# Patient Record
Sex: Female | Born: 2000 | Race: White | Hispanic: No | Marital: Single | Smoking: Never smoker
Health system: Southern US, Community
[De-identification: ages and names within clinical notes are randomized; demographics above are authoritative.]

## PROBLEM LIST (undated history)

## (undated) DIAGNOSIS — G43909 Migraine, unspecified, not intractable, without status migrainosus: Secondary | ICD-10-CM

## (undated) DIAGNOSIS — T7840XA Allergy, unspecified, initial encounter: Secondary | ICD-10-CM

## (undated) DIAGNOSIS — F419 Anxiety disorder, unspecified: Secondary | ICD-10-CM

## (undated) HISTORY — DX: Allergy, unspecified, initial encounter: T78.40XA

## (undated) HISTORY — DX: Migraine, unspecified, not intractable, without status migrainosus: G43.909

## (undated) HISTORY — DX: Anxiety disorder, unspecified: F41.9

---

## 2019-10-07 ENCOUNTER — Encounter: Payer: Self-pay | Admitting: Physical Therapy

## 2019-10-07 ENCOUNTER — Ambulatory Visit: Payer: Self-pay | Attending: Physician Assistant | Admitting: Physical Therapy

## 2019-10-07 ENCOUNTER — Other Ambulatory Visit: Payer: Self-pay

## 2019-10-07 DIAGNOSIS — R278 Other lack of coordination: Secondary | ICD-10-CM | POA: Insufficient documentation

## 2019-10-07 DIAGNOSIS — R252 Cramp and spasm: Secondary | ICD-10-CM | POA: Insufficient documentation

## 2019-10-07 NOTE — Patient Instructions (Addendum)
Guided Meditation for Pelvic Floor Relaxation  [employer]   Pelvic Floor Release Stretches (NEW)  [employer]  [facility] [address], [address] [place], [place] [address] Phone # [phone] Fax [phone]

## 2019-10-10 NOTE — Therapy (Signed)
[facility] [address], [place], [place], [address] Phone: [phone]   Fax:  [phone]  Physical Therapy Evaluation  Patient Details  Name: Allison Cole MRN: [id number] Date of Birth: [birth year] Referring Provider (PT): Elmer Sow PA-C; Jeannie Done, CNM   Encounter Date: 10/07/2019  Visit 1 PT Start time: 8:45 PT end time: 9:25 Total time 35 minutes Equipment Utilized During Treatment --  Activity Tolerance Patient tolerated treatment well; No increased pain  Behavior During Therapy [facility] for tasks assessed/performed    Equipment Utilized During Treatment --  Activity Tolerance Patient tolerated treatment well; No increased pain  Behavior During Therapy WFL for tasks assessed/performed     Past Medical History:  Diagnosis Date  . Allergy   . Anxiety   . Migraines     History reviewed. No pertinent surgical history.  There were no vitals filed for this visit.  Overall strength is 5/5 tenderness located in the right lower abdomen,  q-tip test with tenderness located on the right side f the vulva and 2 O'clock Patient confirms identification and approves PT to assess pelvic floor and treatment Exam type is vaginal Pelvic floor strength is 2/5 Patient was able to tolerate the insertion of the q-tip but she had pain on the sides of the vaginal; contraction was more posterior than anterior, did not use the therapist index finger due to pain Marinoff score is 2/3. Patient reports no urinary leakage.   Plan   Clinical Impression Statement Patient is a 19 year old female with vaginismus for the past 8 months. Patient does reports she did change her birth control around that time. Patient pain level with penetration of the vaginal canal is at 6/10. Patient has pain with intercourse and has to stop in the middle, pain with tampon and vaginal exam. Patient right ilium is anteriorly rotated and positive  compression test for SI on the right. Q-tip test shows tenderness located on the right side of the vulva and at 1 O'clock. Patient had tenderness with inserting the Q-tip into the vaginal canal. Patient was able to perform a pelvic floor contraction with posterior lift and no movement of the clitoris. Patient will benefit from skilled therapy to improve elongation of the vaginal tissue, desensitize the vulva area, correct pelvis and work on coordination of the muscles.  Personal Factors and Comorbidities Sex  Examination-Activity Limitations --  Examination-Participation Restrictions Interpersonal Relationship  Pt will benefit from skilled therapeutic intervention in order to improve on the following deficits Decreased coordination; Increased fascial restricitons; Increased muscle spasms; Pain; Decreased activity tolerance  Stability/Clinical Decision Making Stable/Uncomplicated  Clinical Decision Making Low  Rehab Potential Excellent  PT Frequency 1x / week  PT Duration 12 weeks  PT Treatment/Interventions Biofeedback; Cryotherapy; Electrical Stimulation; Moist Heat; Neuromuscular re-education; Therapeutic exercise; Therapeutic activities; Patient/family education; Manual techniques; Dry needling  PT Next Visit Plan tissue rolling of the thighs, correct pelvis, fascial release around the vagina  PT Home Exercise Plan --  Recommended Other Services --  Consulted and Agree with Plan of Care Patient                    Objective measurements completed on examination: See above findings.                PT Short Term Goals - 10/07/19 1032      PT SHORT TERM GOAL #1   Title  independent with initial HEP    Time  4    Period  Weeks    Status  New    Target Date  11/04/19      PT SHORT TERM GOAL #2   Title  understand how to perform gentle massage around the vulva area to improve blood flow    Time  4    Period  Weeks    Status  New    Target Date  11/04/19       PT SHORT TERM GOAL #3   Title  does pelvic floor meditation 2-3 times per week to desensitize the vaginal area and calm the nervouse system    Time  4    Period  Weeks    Status  New    Target Date  11/04/19      PT SHORT TERM GOAL #4   Title  able to have a q-tip inserted into the vaginal canal with no more than 1/10 pain.    Time  4    Period  Weeks    Status  New    Target Date  11/04/19        PT Long Term Goals - 10/07/19 1035      PT LONG TERM GOAL #1   Title  independent with advanced HEP    Time  12    Period  Weeks    Status  New    Target Date  12/30/19      PT LONG TERM GOAL #2   Title  Marinoff score with penile penetration 1/3 so she is able to have penetrative intercourse and not have to stop in the middle    Time  12    Period  Weeks    Status  New    Target Date  12/30/19      PT LONG TERM GOAL #3   Title  able to take her Nuva ring in and out of the vaginal canal with not more than 1-2/10 pain    Time  12    Period  Weeks    Status  New    Target Date  12/30/19      PT LONG TERM GOAL #4   Title  able to use a tampon during her cycle with no more than 1-2/10 pain due to elongation of the vaginal tissue    Time  12    Period  Weeks    Status  New    Target Date  12/30/19               Patient will benefit from skilled therapeutic intervention in order to improve the following deficits and impairments:  Decreased coordination, Increased fascial restricitons, Increased muscle spasms, Pain, Decreased activity tolerance  Visit Diagnosis: Other lack of coordination - Plan: PT plan of care cert/re-cert  Cramp and spasm - Plan: PT plan of care cert/re-cert     Problem List There are no problems to display for this patient.   Lovey Crupi 10/10/2019, 12:53 PM  [facility] [address] [place], [place], [address] Phone: [phone]   Fax:  [phone]  Name: Allison Cole MRN: [id number] Date of Birth: 2001/03/29

## 2019-11-11 ENCOUNTER — Other Ambulatory Visit: Payer: Self-pay

## 2019-11-11 ENCOUNTER — Ambulatory Visit: Payer: Self-pay | Attending: Physician Assistant | Admitting: Physical Therapy

## 2019-11-11 ENCOUNTER — Encounter: Payer: Self-pay | Admitting: Physical Therapy

## 2019-11-11 DIAGNOSIS — R278 Other lack of coordination: Secondary | ICD-10-CM | POA: Insufficient documentation

## 2019-11-11 DIAGNOSIS — R252 Cramp and spasm: Secondary | ICD-10-CM | POA: Insufficient documentation

## 2019-11-11 NOTE — Therapy (Signed)
[facility] [address], [address] [place], [place], [address] Phone: [phone]   Fax:  [phone]  Physical Therapy Treatment  Patient Details  Name: Allison Cole MRN: [id number] Date of Birth: 07-16-2000 Referring Provider (PT): Caryl Bis PA-C; Ermelinda Das, CNM   Encounter Date: 11/11/2019  PT End of Session - 11/11/19 0937    Visit Number  2    Date for PT Re-Evaluation  12/30/19    PT Start Time  0930    PT Stop Time  1010    PT Time Calculation (min)  40 min    Activity Tolerance  Patient tolerated treatment well;No increased pain    Behavior During Therapy  WFL for tasks assessed/performed       Past Medical History:  Diagnosis Date  . Allergy   . Anxiety   . Migraines     History reviewed. No pertinent surgical history.  There were no vitals filed for this visit.  Subjective Assessment - 11/11/19 0933    Subjective  The outside of the vaginal feels great. Patient had her menstration and tampon has hurt. The meditation and HEP has helped. Taking the ring out does not help.    Patient Stated Goals  have comfortable sex    Currently in Pain?  Yes    Pain Score  7     Pain Location  Vagina    Pain Orientation  Mid   deeper   Pain Descriptors / Indicators  --   stinging   Pain Type  Acute pain    Pain Onset  More than a month ago    Pain Frequency  Intermittent    Aggravating Factors   deeper penile penetration; initial grabing of the ring hurts    Pain Relieving Factors  no penile penetration                     Pelvic Floor Special Questions - 11/11/19 0001    Pelvic Floor Internal Exam  Patient confirms identification and approves PT to assess pelvic floor and treatment    Exam Type  Vaginal        [facility] Adult PT Treatment/Exercise - 11/11/19 0001      Lumbar Exercises: Stretches   Lower Trunk Rotation  2 reps;30 seconds    Lower Trunk Rotation Limitations  right, left    Press Ups  10 reps      Lumbar Exercises: Quadruped   Madcat/Old Horse  15 reps      Manual Therapy   Manual Therapy  Soft tissue mobilization;Internal Pelvic Floor    Soft tissue mobilization  to bilateral hip adductors to relax the muslces and prepare for tissue work to the vaginal    Internal Pelvic Floor  fascial  release of the introitus with using the desert harvest revelum. able to place the finger in 0.5 inch             PT Education - 11/11/19 1009    Education Details  education on how to massage the internal introitus    Person(s) Educated  Patient    Methods  Explanation;Demonstration;Handout    Comprehension  Verbalized understanding       PT Short Term Goals - 11/11/19 1015      PT SHORT TERM GOAL #1   Title  independent with initial HEP    Time  4    Period  Weeks    Status  Achieved  PT SHORT TERM GOAL #2   Title  understand how to perform gentle massage around the vulva area to improve blood flow    Time  4    Period  Weeks    Status  Achieved      PT SHORT TERM GOAL #3   Title  does pelvic floor meditation 2-3 times per week to desensitize the vaginal area and calm the nervouse system    Time  4    Period  Weeks    Status  Achieved      PT SHORT TERM GOAL #4   Title  able to have a q-tip inserted into the vaginal canal with no more than 1/10 pain.    Time  4    Period  Weeks    Status  Achieved        PT Long Term Goals - 10/07/19 1035      PT LONG TERM GOAL #1   Title  independent with advanced HEP    Time  12    Period  Weeks    Status  New    Target Date  12/30/19      PT LONG TERM GOAL #2   Title  Marinoff score with penile penetration 1/3 so she is able to have penetrative intercourse and not have to stop in the middle    Time  12    Period  Weeks    Status  New    Target Date  12/30/19      PT LONG TERM GOAL #3   Title  able to take her Nuva ring in and out of the vaginal canal with not more than 1-2/10 pain    Time   12    Period  Weeks    Status  New    Target Date  12/30/19      PT LONG TERM GOAL #4   Title  able to use a tampon during her cycle with no more than 1-2/10 pain due to elongation of the vaginal tissue    Time  12    Period  Weeks    Status  New    Target Date  12/30/19            Plan - 11/11/19 0936    Clinical Impression Statement  Patient is using her meditation and stretches for the pelvic floor. Patient is able to touch the outside of the vulvar area without difficulty. Patient is able to take her ring out without pain but has some pain with placing it in. Patient has pain with deep penetration compared to intial penetration. Patient was able to tolerate therapist to place her index finger into the vaginal canal 0.5 inches. Patient will benefit from skilled therapy to improve elongation of the vaginal tissue, desesitize the vulva area, correct pelvis and work on coordination fo the muscles.    Personal Factors and Comorbidities  Sex    Examination-Participation Restrictions  Interpersonal Relationship    Stability/Clinical Decision Making  Stable/Uncomplicated    Rehab Potential  Excellent    PT Frequency  1x / week    PT Duration  12 weeks    PT Treatment/Interventions  Biofeedback;Cryotherapy;Electrical Stimulation;Moist Heat;Neuromuscular re-education;Therapeutic exercise;Therapeutic activities;Patient/family education;Manual techniques;Dry needling    PT Next Visit Plan  fascial release around the vagina and internally, correct pelvis, check lumbar ROM    Consulted and Agree with Plan of Care  Patient       Patient will benefit from skilled  therapeutic intervention in order to improve the following deficits and impairments:  Decreased coordination, Increased fascial restricitons, Increased muscle spasms, Pain, Decreased activity tolerance  Visit Diagnosis: Other lack of coordination  Cramp and spasm     Problem List There are no problems to display for this  patient.   Eulis Foster, PT 11/11/19 10:17 AM   [facility] [address] [place], [place], [address] Phone: [phone]   Fax:  [phone]  Name: Allison Cole MRN: [id number] Date of Birth: 2001-06-12

## 2019-11-11 NOTE — Patient Instructions (Signed)
STRETCHING THE PELVIC FLOOR MUSCLES NO DILATOR  Supplies . Vaginal lubricant . Mirror (optional) . Gloves (optional) Positioning . Start in a semi-reclined position with your head propped up. Bend your knees and place your thumb or finger at the vaginal opening. Procedure . Apply a moderate amount of lubricant on the outer skin of your vagina, the labia minora.  Apply additional lubricant to your finger. Marland Kitchen Spread the skin away from the vaginal opening. Place the end of your finger at the opening. . Do a maximum contraction of the pelvic floor muscles. Tighten the vagina and the anus maximally and relax. . When you know they are relaxed, gently and slowly insert your finger into your vagina, directing your finger slightly downward, for 2-3 inches of insertion. . Relax and stretch the 6 o'clock position . Hold each stretch for _2 min__ and repeat __1_ time with rest breaks of _1__ seconds between each stretch. . Repeat the stretching in the 4 o'clock and 8 o'clock positions. . Total time should be _6__ minutes, _1__ x per day.  Note the amount of theme your were able to achieve and your tolerance to your finger in your vagina. . Once you have accomplished the techniques you may try them in standing with one foot resting on the tub, or in other positions.  This is a good stretch to do in the shower if you don't need to use lubricant.   then move your finger from the top to the midline 15 times per each side Do daily  [facility] [address] [place], [place] [address] Phone # [phone] Fax [phone]

## 2019-11-18 ENCOUNTER — Other Ambulatory Visit: Payer: Self-pay

## 2019-11-18 ENCOUNTER — Encounter: Payer: Self-pay | Admitting: Physical Therapy

## 2019-11-18 ENCOUNTER — Ambulatory Visit: Payer: Self-pay | Admitting: Physical Therapy

## 2019-11-18 DIAGNOSIS — R278 Other lack of coordination: Secondary | ICD-10-CM

## 2019-11-18 DIAGNOSIS — R252 Cramp and spasm: Secondary | ICD-10-CM

## 2019-11-18 NOTE — Therapy (Addendum)
[facility] [address], [place], [place], [address] Phone: [phone]   Fax:  [phone]  Physical Therapy Treatment  Patient Details  Name: Allison Cole MRN: [id number] Date of Birth: 01/15/2001 Referring Provider (PT): Allison Sow PA-C; Allison Cole, CNM   Encounter Date: 11/18/2019  PT End of Session - 11/18/19 0929    Visit Number  3    Date for PT Re-Evaluation  12/30/19    PT Start Time  0845    PT Stop Time  0925    PT Time Calculation (min)  40 min    Activity Tolerance  Patient tolerated treatment well;No increased pain    Behavior During Therapy  WFL for tasks assessed/performed       Past Medical History:  Diagnosis Date  . Allergy   . Anxiety   . Migraines     History reviewed. No pertinent surgical history.  There were no vitals filed for this visit.  Subjective Assessment - 11/18/19 0848    Subjective  During intercourse it is difficult. During insertion I have burning intense pain during intercourse and after he has pulled out. When put the ring in at the end point pain level 2-3/10.    Patient Stated Goals  have comfortable sex    Currently in Pain?  Yes    Pain Score  6     Pain Location  Vagina    Pain Orientation  Mid   deeper   Pain Descriptors / Indicators  Burning   stinging   Pain Type  Acute pain    Pain Onset  More than a month ago    Pain Frequency  Intermittent    Aggravating Factors   deeper penile penetration, initial grabbing of the ring hurts    Pain Relieving Factors  no penile penetration         [facility] PT Assessment - 11/18/19 0001      Palpation   SI assessment   ASIS is in good alignment                 Pelvic Floor Special Questions - 11/18/19 0001    Pelvic Floor Internal Exam  Patient confirms identification and approves PT to assess pelvic floor and treatment    Exam Type  Vaginal        [facility] Adult PT Treatment/Exercise - 11/18/19  0001      Self-Care   Self-Care  Other Self-Care Comments    Other Self-Care Comments   education on using dilators, where to find them and how they work      Manual Therapy   Manual Therapy  Internal Pelvic Floor;Myofascial release    Soft tissue mobilization  fascial release around the labia area and along the clitorus with Desert Harvest Revelum    Myofascial Release  using iastim device working on the innter thigh and along the sides of the perineam outside her pants to release the tissue    Internal Pelvic Floor  fascial  release of the introitus with using the desert harvest revelum. able to place the finger in 0.5 inch   used [employer] REvelum            PT Education - 11/18/19 [phone]    Education Details  education on different dilators; samples of Desert Harvest REveleum    Methods  Explanation;Handout    Comprehension  Verbalized understanding       PT Short Term Goals - 11/11/19 1015  PT SHORT TERM GOAL #1   Title  independent with initial HEP    Time  4    Period  Weeks    Status  Achieved      PT SHORT TERM GOAL #2   Title  understand how to perform gentle massage around the vulva area to improve blood flow    Time  4    Period  Weeks    Status  Achieved      PT SHORT TERM GOAL #3   Title  does pelvic floor meditation 2-3 times per week to desensitize the vaginal area and calm the nervouse system    Time  4    Period  Weeks    Status  Achieved      PT SHORT TERM GOAL #4   Title  able to have a q-tip inserted into the vaginal canal with no more than 1/10 pain.    Time  4    Period  Weeks    Status  Achieved        PT Long Term Goals - 11/18/19 1014      PT LONG TERM GOAL #1   Title  independent with advanced HEP    Time  12    Period  Weeks    Status  On-going      PT LONG TERM GOAL #2   Title  Marinoff score with penile penetration 1/3 so she is able to have penetrative intercourse and not have to stop in the middle    Time  12     Period  Weeks    Status  On-going      PT LONG TERM GOAL #3   Title  able to take her Nuva ring in and out of the vaginal canal with not more than 1-2/10 pain    Time  12    Period  Weeks    Status  Achieved      PT LONG TERM GOAL #4   Title  able to use a tampon during her cycle with no more than 1-2/10 pain due to elongation of the vaginal tissue    Time  12    Period  Weeks    Status  On-going            Plan - 11/18/19 0930    Clinical Impression Statement  Patient reports her symptoms have started after she has started a new irth control. She is going to talk to the physician about changing if this could be the cause. Patient is doing well with her HEP. Patient pelvis in correct alignment. Patient was educated on dilators to expand the vaginal canal due to the pain with intercourse. Patient was able to tolerate the soft tissue work to the vulva area and able to tolerate 1.5 inches of the therapist finger in the vaginal canal. Patient will benefit from skilled therapy to improve elongation fo the vaginal tissue desensitize the vulva area and improve coordination of the muscles.    Personal Factors and Comorbidities  Sex    Examination-Participation Restrictions  Interpersonal Relationship    Stability/Clinical Decision Making  Stable/Uncomplicated    Rehab Potential  Excellent    PT Frequency  1x / week    PT Duration  12 weeks    PT Treatment/Interventions  Biofeedback;Cryotherapy;Electrical Stimulation;Moist Heat;Neuromuscular re-education;Therapeutic exercise;Therapeutic activities;Patient/family education;Manual techniques;Dry needling    PT Next Visit Plan  fascial release around the vagina and internally, check lumbar ROM; see if she talked  to MD about birth control    Recommended Other Services  MD signed renewal    Consulted and Agree with Plan of Care  Patient       Patient will benefit from skilled therapeutic intervention in order to improve the following  deficits and impairments:  Decreased coordination, Increased fascial restricitons, Increased muscle spasms, Pain, Decreased activity tolerance  Visit Diagnosis: Other lack of coordination  Cramp and spasm     Problem List There are no problems to display for this patient.   Earlie Counts, PT 11/18/19 10:17 AM   [facility] [address], [place], [place], [address] Phone: [phone]   Fax:  [phone]  Name: Allison Cole MRN: [id number] Date of Birth: 2000/08/25  PHYSICAL THERAPY DISCHARGE SUMMARY  Visits from Start of Care: 3  Current functional level related to goals / functional outcomes: See above. Patient has not returned since 10/31/2019.    Remaining deficits: See above.    Education / Equipment: HEP Plan:                                                    Patient goals were not met. Patient is being discharged due to not returning since the last visit.  Thank you for the referral. Earlie Counts, PT 12/29/19 11:43 AM  ?????

## 2019-11-18 NOTE — Patient Instructions (Addendum)
[email or web address]    [email or web address]       [email or web address] [address] [place], [place] [address] Phone # [phone] Fax [phone]

## 2019-11-25 ENCOUNTER — Telehealth: Payer: Self-pay | Admitting: Physical Therapy

## 2019-11-25 ENCOUNTER — Ambulatory Visit: Payer: Self-pay | Admitting: Physical Therapy

## 2019-11-25 NOTE — Telephone Encounter (Signed)
Called patient about her appointment she missed at 8:45 today. Left a message.  Eulis Foster, PT @5 /27/2021@ 9:15 AM

## 2019-12-09 ENCOUNTER — Ambulatory Visit: Payer: Self-pay | Attending: Physician Assistant | Admitting: Physical Therapy

## 2019-12-09 ENCOUNTER — Telehealth: Payer: Self-pay | Admitting: Physical Therapy

## 2019-12-09 NOTE — Telephone Encounter (Signed)
Called patient about her appoint she missed at 8:45. Unable to leave a message due to number not in service.  Eulis Foster, PT @6 /03/2020@ 9:08 AM

## 2019-12-15 ENCOUNTER — Encounter (HOSPITAL_COMMUNITY): Payer: Self-pay | Admitting: Emergency Medicine

## 2019-12-15 ENCOUNTER — Emergency Department (HOSPITAL_COMMUNITY): Payer: Self-pay

## 2019-12-15 ENCOUNTER — Other Ambulatory Visit: Payer: Self-pay

## 2019-12-15 ENCOUNTER — Emergency Department (HOSPITAL_COMMUNITY)
Admission: EM | Admit: 2019-12-15 | Discharge: 2019-12-15 | Disposition: A | Discharge status: Home / Self Care | Payer: Self-pay | Attending: Emergency Medicine | Admitting: Emergency Medicine

## 2019-12-15 DIAGNOSIS — Z91040 Latex allergy status: Secondary | ICD-10-CM | POA: Insufficient documentation

## 2019-12-15 DIAGNOSIS — R1012 Left upper quadrant pain: Secondary | ICD-10-CM | POA: Insufficient documentation

## 2019-12-15 DIAGNOSIS — R109 Unspecified abdominal pain: Secondary | ICD-10-CM

## 2019-12-15 DIAGNOSIS — N939 Abnormal uterine and vaginal bleeding, unspecified: Secondary | ICD-10-CM | POA: Insufficient documentation

## 2019-12-15 DIAGNOSIS — R350 Frequency of micturition: Secondary | ICD-10-CM | POA: Insufficient documentation

## 2019-12-15 DIAGNOSIS — R1011 Right upper quadrant pain: Secondary | ICD-10-CM | POA: Insufficient documentation

## 2019-12-15 LAB — COMPREHENSIVE METABOLIC PANEL
ALT: 22 U/L (ref 0–44)
AST: 14 U/L — ABNORMAL LOW (ref 15–41)
Albumin: 3.5 g/dL (ref 3.5–5.0)
Alkaline Phosphatase: 45 U/L (ref 38–126)
Anion gap: 9 (ref 5–15)
BUN: 9 mg/dL (ref 6–20)
CO2: 22 mmol/L (ref 22–32)
Calcium: 9 mg/dL (ref 8.9–10.3)
Chloride: 107 mmol/L (ref 98–111)
Creatinine, Ser: 0.88 mg/dL (ref 0.44–1.00)
GFR calc Af Amer: >60 mL/min (ref >60)
GFR calc non Af Amer: >60 mL/min (ref >60)
Glucose, Bld: 93 mg/dL (ref 70–99)
Potassium: 4 mmol/L (ref 3.5–5.1)
Sodium: 138 mmol/L (ref 135–145)
Total Bilirubin: 0.4 mg/dL (ref 0.3–1.2)
Total Protein: 6.9 g/dL (ref 6.5–8.1)

## 2019-12-15 LAB — CBC WITH DIFFERENTIAL/PLATELET
Abs Immature Granulocytes: 0.02 10*3/uL (ref 0.00–0.07)
Basophils Absolute: 0 10*3/uL (ref 0.0–0.1)
Basophils Relative: 0 %
Eosinophils Absolute: 0 10*3/uL (ref 0.0–0.5)
Eosinophils Relative: 0 %
HCT: 38.8 % (ref 36.0–46.0)
Hemoglobin: 12.2 g/dL (ref 12.0–15.0)
Immature Granulocytes: 0 %
Lymphocytes Relative: 35 %
Lymphs Abs: 2.8 10*3/uL (ref 0.7–4.0)
MCH: 28 pg (ref 26.0–34.0)
MCHC: 31.4 g/dL (ref 30.0–36.0)
MCV: 89.2 fL (ref 80.0–100.0)
Monocytes Absolute: 0.6 10*3/uL (ref 0.1–1.0)
Monocytes Relative: 8 %
Neutro Abs: 4.5 10*3/uL (ref 1.7–7.7)
Neutrophils Relative %: 57 %
Platelets: 286 10*3/uL (ref 150–400)
RBC: 4.35 MIL/uL (ref 3.87–5.11)
RDW: 12 % (ref 11.5–15.5)
WBC: 8 10*3/uL (ref 4.0–10.5)
nRBC: 0 % (ref 0.0–0.2)

## 2019-12-15 LAB — URINALYSIS, MICROSCOPIC (REFLEX)

## 2019-12-15 LAB — URINALYSIS, ROUTINE W REFLEX MICROSCOPIC
Bilirubin Urine: NEGATIVE
Glucose, UA: NEGATIVE mg/dL
Ketones, ur: NEGATIVE mg/dL
Leukocytes,Ua: NEGATIVE
Nitrite: NEGATIVE
Protein, ur: NEGATIVE mg/dL
Specific Gravity, Urine: >1.03 — ABNORMAL HIGH (ref 1.005–1.030)
pH: 5.5 (ref 5.0–8.0)

## 2019-12-15 LAB — I-STAT BETA HCG BLOOD, ED (MC, WL, AP ONLY): I-stat hCG, quantitative: <5 m[IU]/mL (ref <5)

## 2019-12-15 LAB — LIPASE, BLOOD: Lipase: 25 U/L (ref 11–51)

## 2019-12-15 NOTE — Discharge Instructions (Addendum)
You have been seen today for vaginal bleeding. Please read and follow all provided instructions. Return to the emergency room for worsening condition or new concerning symptoms.    1. Medications:  Recommend you take tylenol or ibuprofen as needed for pain. Take as directed on the bottle.   Continue usual home medications Take medications as prescribed. Please review all of the medicines and only take them if you do not have an allergy to them.   2. Treatment: rest, drink plenty of fluids  3. Follow Up:  Please follow up with primary care provider by scheduling an appointment as soon as possible for a visit  If you do not have a primary care physician, contact [facility] at [phone] for referral  -Also recommend you follow up with the [facility] to further discuss your vaginal bleeding if it continues. There contact information is provided in your discharge paperwork.  ?

## 2019-12-15 NOTE — ED Provider Notes (Signed)
[facility] EMERGENCY DEPARTMENT Provider Note   CSN: [id number] Arrival date & time: 12/15/19  1453     History Chief Complaint  Patient presents with  . Vaginal Bleeding  . Abdominal Pain    Allison Cole is a 19 y.o. female with past medical history significant for migraines, anxiety.  HPI Patient presents to emergency department today with chief complaint of sudden onset of vaginal bleeding and abdominal pain.  Patient states she has had NuvaRing as her birth control method x1 year without any difficulties.  She has not had regular periods, only occasional brown spotting.  She states today she had bright red vaginal bleeding as well as passing multiple clots.  She states is very unusual for her.  She is also endorsing urinary frequency x3 days as well as intermittent abdominal pain and nonbloody diarrhea x 1 month.  She is reporting intermittent abdominal pain located in bilateral upper quadrants. No pain currently. Last bowel movement was this morning. she has history of ovarian cyst. No medications for symptom prior to arrival.  Patient states she has not been sexually active in x1 month.  She is also currently undergoing PT for pelvic floor with her last session being x3 weeks ago.  She denies any fever, chills, cough, chest pain, shortness of breath, syncope, palpitations, back pain, flank pain, nausea, emesis, dysuria, pelvic pain, vaginal discharge.  Denies abdominal surgical history.  Denies history of STIs.    Past Medical History:  Diagnosis Date  . Allergy   . Anxiety   . Migraines     There are no problems to display for this patient.   History reviewed. No pertinent surgical history.   OB History   No obstetric history on file.     No family history on file.  Social History   Tobacco Use  . Smoking status: Never Smoker  . Smokeless tobacco: Never Used  Substance Use Topics  . Alcohol use: Never  . Drug use: Never    Home  Medications Prior to Admission medications   Medication Sig Start Date End Date Taking? Authorizing Provider  etonogestrel-ethinyl estradiol (NUVARING) 0.12-0.015 MG/24HR vaginal ring Place 1 each vaginally every 28 (twenty-eight) days. Insert vaginally and leave in place for 3 consecutive weeks, then remove for 1 week.   Yes [provider]    Allergies    Other, Sulfa antibiotics, and Latex  Review of Systems   Review of Systems All other systems are reviewed and are negative for acute change except as noted in the HPI.  Physical Exam Updated Vital Signs BP 124/78 (BP Location: Right Arm)   Pulse 94   Temp 98.2 F (36.8 C) (Oral)   Resp 14   Ht 5\' 6"  (1.676 m)   Wt 83.5 kg   LMP 12/01/2019   SpO2 100%   BMI 29.70 kg/m   Physical Exam Vitals and nursing note reviewed.  Constitutional:      General: She is not in acute distress.    Appearance: She is not ill-appearing.  HENT:     Head: Normocephalic and atraumatic.     Right Ear: Tympanic membrane and external ear normal.     Left Ear: Tympanic membrane and external ear normal.     Nose: Nose normal.     Mouth/Throat:     Mouth: Mucous membranes are moist.     Pharynx: Oropharynx is clear.  Eyes:     General: No scleral icterus.  Right eye: No discharge.        Left eye: No discharge.     Extraocular Movements: Extraocular movements intact.     Conjunctiva/sclera: Conjunctivae normal.     Pupils: Pupils are equal, round, and reactive to light.  Neck:     Vascular: No JVD.  Cardiovascular:     Rate and Rhythm: Normal rate and regular rhythm.     Pulses: Normal pulses.          Radial pulses are 2+ on the right side and 2+ on the left side.     Heart sounds: Normal heart sounds.  Pulmonary:     Comments: Lungs clear to auscultation in all fields. Symmetric chest rise. No wheezing, rales, or rhonchi. Abdominal:     Comments: Abdomen is soft, non-distended, and non-tender in all quadrants. No  rigidity, no guarding. No peritoneal signs.  Genitourinary:    Comments: Normal external genitalia. No pain with speculum insertion. Closed cervical os with normal appearance - no rash or lesions. No significant discharge noted from cervix or in vaginal vault. There is brown blood seen in vaginal vault without clots. No signs of hemorrhagic bleeding. On bimanual examination no adnexal tenderness or cervical motion tenderness. Chaperone Fish farm manager present during exam.  Musculoskeletal:        General: Normal range of motion.     Cervical back: Normal range of motion.  Skin:    General: Skin is warm and dry.     Capillary Refill: Capillary refill takes less than 2 seconds.  Neurological:     Mental Status: She is oriented to person, place, and time.     GCS: GCS eye subscore is 4. GCS verbal subscore is 5. GCS motor subscore is 6.     Comments: Fluent speech, no facial droop.  Psychiatric:        Behavior: Behavior normal.     ED Results / Procedures / Treatments   Labs (all labs ordered are listed, but only abnormal results are displayed) Labs Reviewed  COMPREHENSIVE METABOLIC PANEL - Abnormal; Notable for the following components:      Result Value   AST 14 (*)    All other components within normal limits  WET PREP, GENITAL  CBC WITH DIFFERENTIAL/PLATELET  LIPASE, BLOOD  URINALYSIS, ROUTINE W REFLEX MICROSCOPIC  I-STAT BETA HCG BLOOD, ED ([facility], [facility], [facility] ONLY)  GC/CHLAMYDIA PROBE AMP ([facility]) NOT AT [facility]    EKG None  Radiology No results found.  Procedures Procedures (including critical care time)  Medications Ordered in ED Medications - No data to display  ED Course  I have reviewed the triage vital signs and the nursing notes.  Pertinent labs & imaging results that were available during my care of the patient were reviewed by me and considered in my medical decision making (see chart for details).    MDM Rules/Calculators/A&P                          History  provided by patient with additional history obtained from chart review.    19 yo female presenting with vaginal bleeding and abdominal pain. Patient presents awake, alert, hemodynamically stable, afebrile, non toxic.  She was noted to be tachycardic to 107 in triage.  At the time of my exam heart rate is in the 90s.  On exam no abdominal tenderness is appreciated. No peritoneal signs. Patient declines need for analgesics or antiemetics.  Pelvic exam performed  with chaperone present.  There is brown blood seen in vaginal vault without signs of hemorrhagic bleeding.  No cervical motion tenderness or adnexal tenderness.  Patient does not wish to have prophylactic STD treatment.  She is aware of positive she will need to let her partner know as they both will need treatment.  CBC shows no leukocytosis, no anemia.  Hemoglobin is stable at 12.2. No indications for transfusion.  Pregnancy test is negative.  CMP unremarkable. Wet prep in process.  Urine analysis not yet collected.  Patient currently having pelvic ultrasound to rule out ovarian torsion.   Patient care transferred to G. Green PA-C at the end of my shift. Patient presentation, ED course, and plan of care discussed with review of all pertinent labs and imaging. Please see his note for further details regarding further ED course and disposition. Anticipate discharge home if work up negative with plan to follow up with [facility].  Portions of this note were generated with Scientist, clinical (histocompatibility and immunogenetics). Dictation errors may occur despite best attempts at proofreading.   Final Clinical Impression(s) / ED Diagnoses Final diagnoses:  Abdominal pain  Vaginal bleeding    Rx / DC Orders ED Discharge Orders    None       Kathyrn Lass 12/15/19 2122    Sabas Sous, MD 12/15/19 2337

## 2019-12-15 NOTE — ED Triage Notes (Signed)
Pt has Nuva Ring.  Reports brown spotting x 2 weeks.  States she had a gush of bright red vaginal bleeding today with "purple clots" and abd cramping.

## 2019-12-15 NOTE — ED Provider Notes (Signed)
Received patient as a handoff at shift change from [employer], PA-C.  Please see prior providers note for further details regarding HPI and assessment.  In short, patient with no relevant past medical history presented to the ED for recent onset vaginal bleeding and lower abdominal discomfort.  She states that she was surprised because she uses the NuvaRing and did not believe that she was supposed to experience menses.  Her physical exam is entirely benign.  No peritoneal signs.  I discussed with handoff provider who reports that her GU exam was unremarkable and without CMT, adnexal tenderness, or evidence of hemorrhagic bleeding.  There is only mild, brown blood possibly suggestive of menses.   At time of handoff, laboratory work-up was complete with exception of UA which was still pending.  She had mentioned that she was having mild dysuria symptoms as well.  There was also a pelvic ultrasound obtained to assess for ovarian torsion that was also still pending.  If all is negative, plan was for discharge home with outpatient follow-up at the [facility] clinic.  AVS already completed by handoff provider.  On my examination, patient is resting comfortably in no acute distress.  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  UA demonstrates elevated specific gravity, possibly suggestive of mild dehydration.  Her vital signs are stable and WNL.  Encouraging patient to drink more fluids.  No evidence of infection.  My physical exam was largely benign.  No tenderness to palpation.  She points to her suprapubic/left-sided pelvic region when describing her area of discomfort.  Pelvic ultrasound was normal and without any intrapelvic abnormalities.  Patient feels prepared for discharge.  Strict ED return precautions discussed.  All of the evaluation and work-up results were discussed with the patient and any family at bedside.  They were provided opportunity to ask any additional questions and have none at this time. They have expressed understanding of verbal discharge instructions as well as return precautions and are agreeable to the plan.     Lorelee New, PA-C 12/15/19 2311    Sabas Sous, MD 12/15/19 [phone]

## 2019-12-16 ENCOUNTER — Encounter: Payer: Self-pay | Admitting: Physical Therapy

## 2019-12-17 LAB — GC/CHLAMYDIA PROBE AMP (~~LOC~~) NOT AT ARMC
Chlamydia: NEGATIVE
Comment: NEGATIVE
Comment: NORMAL
Neisseria Gonorrhea: NEGATIVE

## 2019-12-23 ENCOUNTER — Ambulatory Visit: Payer: Self-pay | Admitting: Physical Therapy

## 2020-08-18 IMAGING — US US PELVIS COMPLETE TRANSABD/TRANSVAG W DUPLEX
1 series · 13 of 25 positions shown · non-contrast
Comparison: None.

CLINICAL DATA: Vaginal bleeding

EXAM:
TRANSABDOMINAL AND TRANSVAGINAL ULTRASOUND OF PELVIS
DOPPLER ULTRASOUND OF OVARIES
TECHNIQUE: Both transabdominal and transvaginal ultrasound examinations of the
pelvis were performed. Transabdominal technique was performed for
global imaging of the pelvis including uterus, ovaries, adnexal
regions, and pelvic cul-de-sac.
It was necessary to proceed with endovaginal exam following the
transabdominal exam to visualize the uterus, endometrium, ovaries
and adnexa. Color and duplex Doppler ultrasound was utilized to
evaluate blood flow to the ovaries.

[Series 1: us pelvic complete w transvaginal and torsion righ · 13 of 69 slices shown]
[im 1/69]
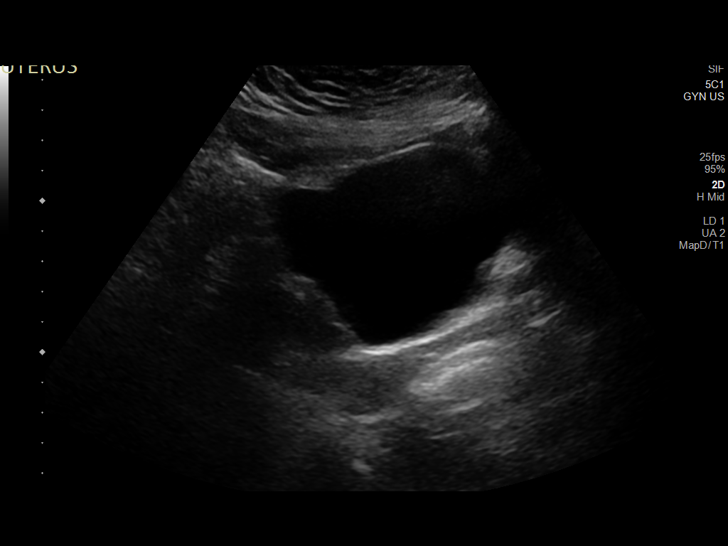
[im 6/69]
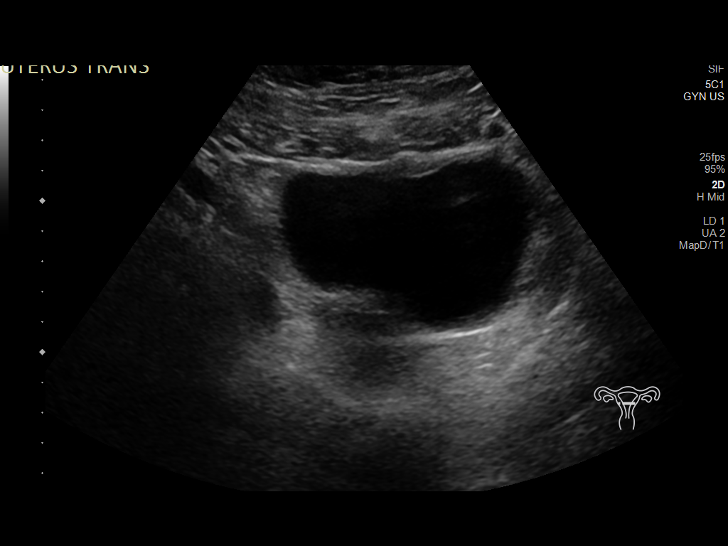
[im 12/69]
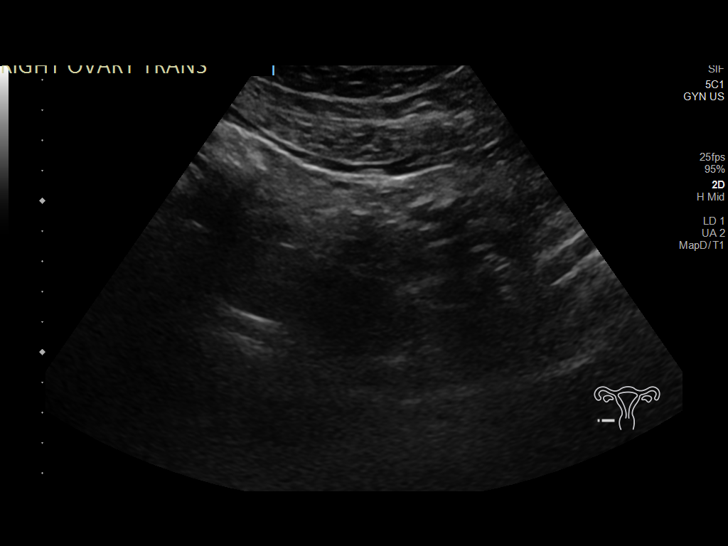
[im 18/69]
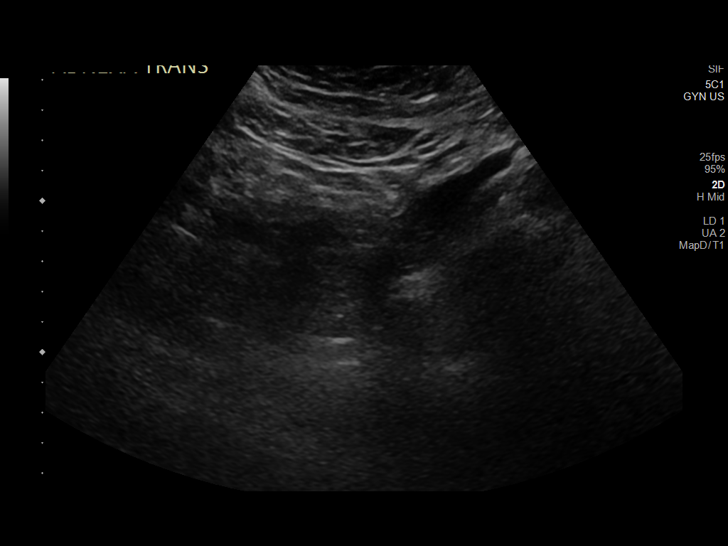
[im 23/69]
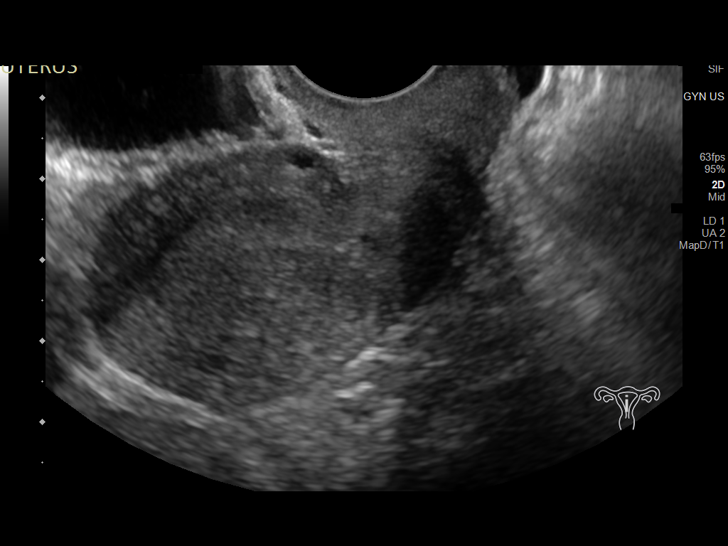
[im 29/69]
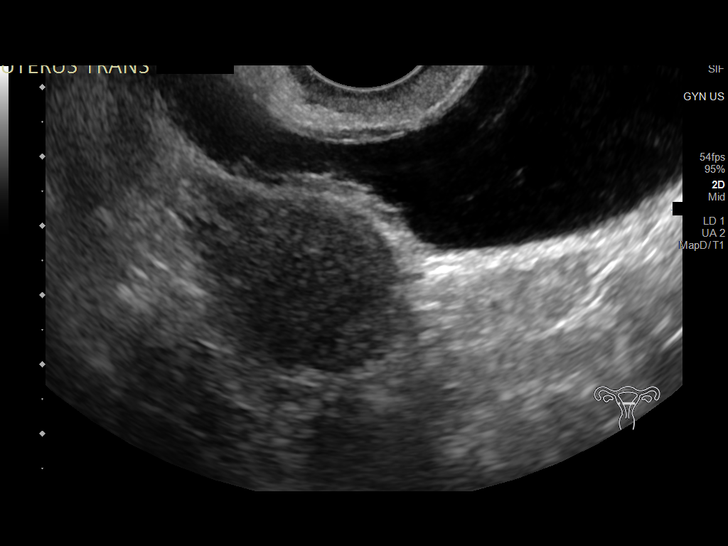
[im 35/69]
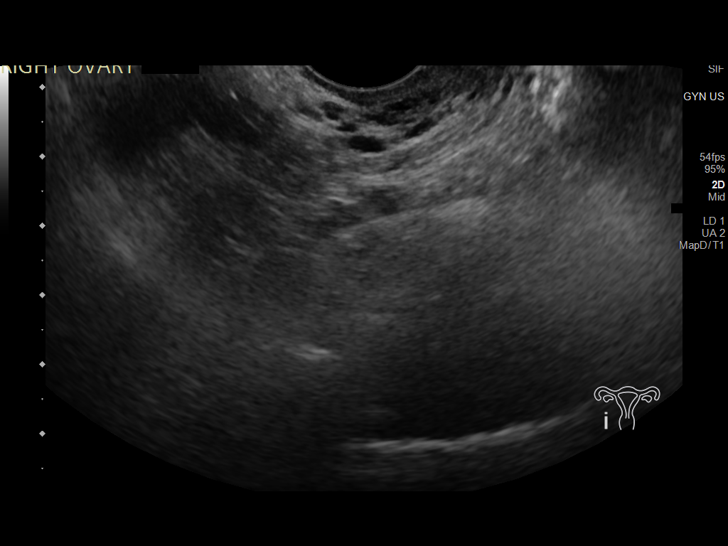
[im 40/69]
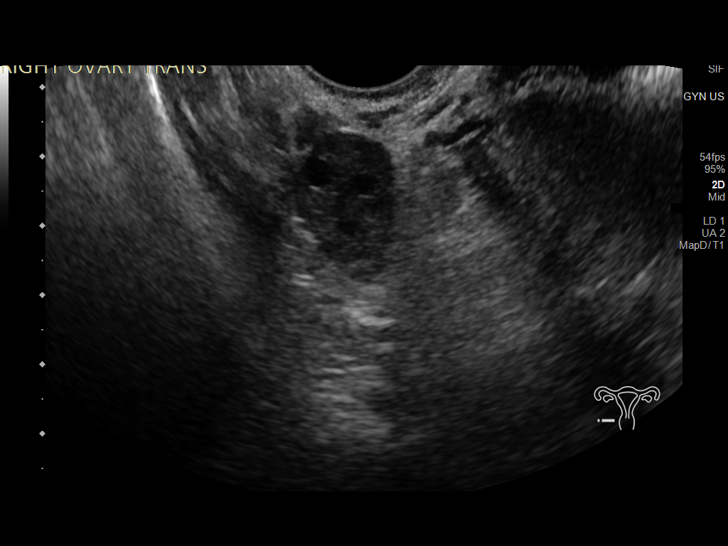
[im 46/69]
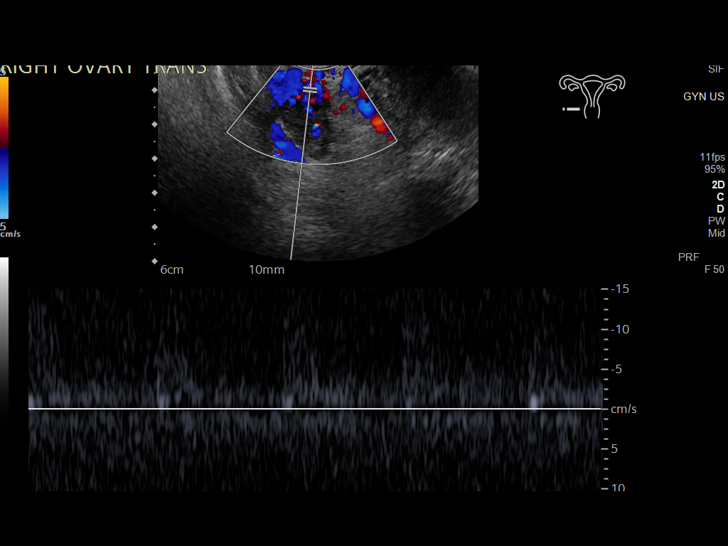
[im 52/69]
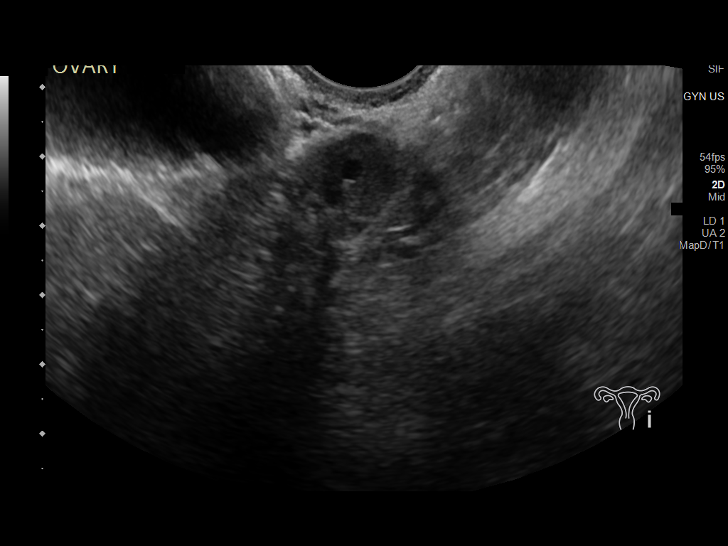
[im 57/69]
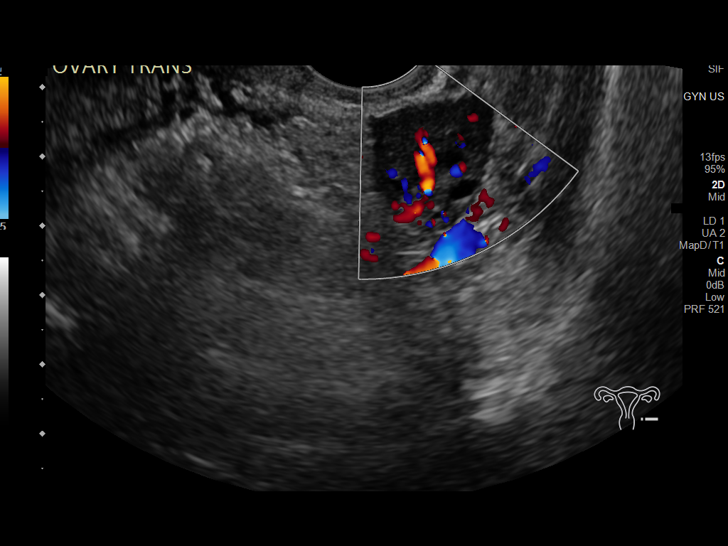
[im 63/69]
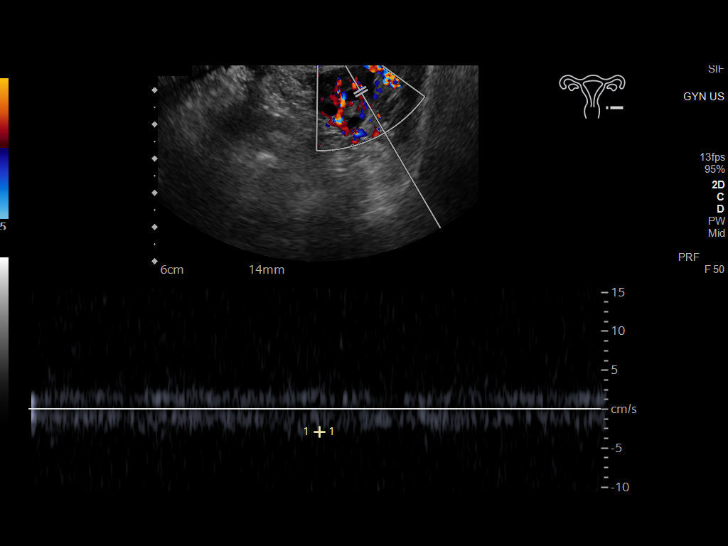
[im 69/69]
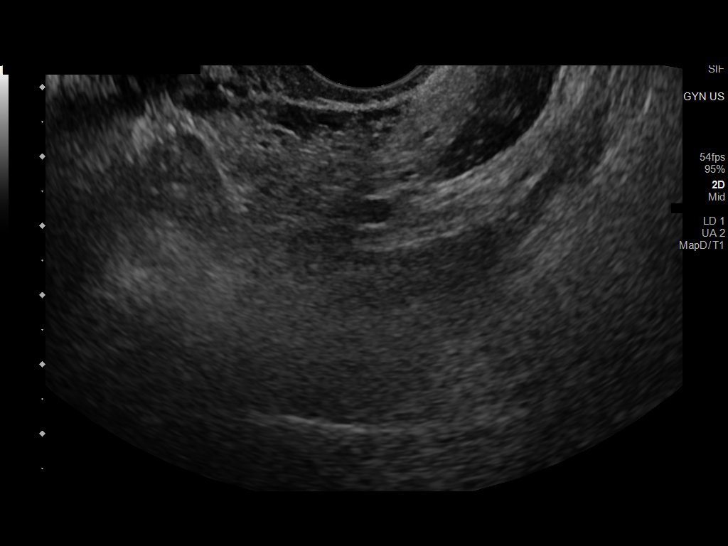

[13 of 25 positions shown; findings below may reference images not displayed]

FINDINGS: Uterus

Measurements: 5.4 x 3.1 x 3.0 cm = volume: 27 mL. No fibroids or
other mass visualized.

Endometrium

Thickness: 4 mm.  No focal abnormality visualized.

Right ovary

Measurements: 2.4 x 1.6 x 1.3 cm = volume: 2.6 mL. Normal
appearance/no adnexal mass.

Left ovary

Measurements: 2.2 x 1.7 x 1.7 cm = volume: 3.2 mL. Normal
appearance/no adnexal mass.

Pulsed Doppler evaluation of both ovaries demonstrates normal
low-resistance arterial and venous waveforms.

Other findings

No abnormal free fluid.
IMPRESSION: Normal pelvic ultrasound.
